# Patient Record
Sex: Male | Born: 1995 | Race: Black or African American | Hispanic: No | Marital: Single | Smoking: Current every day smoker
Health system: Southern US, Community
[De-identification: ages and names within clinical notes are randomized; demographics above are authoritative.]

---
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
## 2014-01-16 ENCOUNTER — Ambulatory Visit (INDEPENDENT_AMBULATORY_CARE_PROVIDER_SITE_OTHER): Payer: Self-pay | Admitting: Family Medicine

## 2014-01-16 VITALS — BP 122/70 | HR 68 | Temp 98.2°F | Resp 18 | Ht 71.0 in | Wt 147.0 lb

## 2014-01-16 DIAGNOSIS — Z87820 Personal history of traumatic brain injury: Secondary | ICD-10-CM

## 2014-01-16 NOTE — Progress Notes (Signed)
18 year old male who is here for follow-up of a concussion he had a decade ago. He has been applying for a job, and they will hard that he be cleared from his concussion. He has been seen once here for it, was told to return if he had further problems, and never had problems and never came back for it. He has been here a time or 2 over the years for other things. He is been quite healthy.  Operations: None Illnesses: None Regular medications: None Allergies: None Drugs: None Alcohol: None Tobacco: None  He went to high school and has been working some various jobs since that time. He hopes to be able to get more education when he gets in the [employer].  Review of systems: Unremarkable  Exam: TMs are normal. Eyes PERRLA. EOMs intact. Throat clear. Neck supple without nodes thyromegaly. No carotid bruits. Chest is clear to auscultation. Heart regular without murmurs. Abdomen soft without mass tenderness. Extremities normal. Gait normal. Motor strength.should grossly normal. Gait normal. Finger to nose normal. Tandem walk normal. Romberg negative.  Assessment: History of concussion, no residual  Plan: Wrote a letter for him. Return as needed

## 2014-01-16 NOTE — Patient Instructions (Signed)
Return as necessary if you need further assistance

## 2015-06-03 ENCOUNTER — Ambulatory Visit (INDEPENDENT_AMBULATORY_CARE_PROVIDER_SITE_OTHER): Payer: BLUE CROSS/BLUE SHIELD | Admitting: Family Medicine

## 2015-06-03 VITALS — BP 104/68 | HR 83 | Temp 98.0°F | Resp 16 | Ht 70.0 in | Wt 154.0 lb

## 2015-06-03 DIAGNOSIS — K0889 Other specified disorders of teeth and supporting structures: Secondary | ICD-10-CM

## 2015-06-03 MED ORDER — IBUPROFEN 600 MG PO TABS: 600.0000 mg | ORAL_TABLET | Freq: Four times a day (QID) | ORAL | Status: DC | PRN

## 2015-06-03 NOTE — Progress Notes (Signed)
Subjective:  By signing my name below, I, William Hardy, attest that this documentation has been prepared under the direction and in the presence of William Staggers, MD. Electronically Signed: Stann Hardy, Scribe. 06/03/2015 , 6:39 PM .  Patient was seen in Room 6 .   Patient ID: William Hardy William Hardy, male    DOB: [birth year], 20 y.o.   MRN: [id number] Chief Complaint  Patient presents with  . Dental Pain   HPI William Hardy is a 20 y.o. male Here for dental pain that started last night. He has supernumerary wisdom teeth.  He states that his left lower wisdom tooth gives him an occasional pain every 2 months. The pain will intensify for a bit, and will eventually reside. He denies taking any medications for the pain.   He has a Education officer, community that he sees. He was recommended to have surgeries done to remove his wisdom teeth; however, he denies history of any dental surgery. He was unable to eat yesterday due to the pain. He denies any fever, chills, facial swelling, or cold sensitivity. He denies history of ulcers.  Dentist: Dr. Gwendolyn Hardy, in [place], [place]  He had ascheduled appointment for today at dentist; however, he wasn't able to be seen. His work allows him 1 sick day a month.   There are no active problems to display for this patient.  History reviewed. No pertinent past medical history. History reviewed. No pertinent past surgical history. No Known Allergies Prior to Admission medications   Medication Sig Start Date End Date Taking? Authorizing Provider  meloxicam (MOBIC) 15 MG tablet Take 1 tablet (15 mg total) by mouth daily. Patient not taking: Reported on 06/03/2015 10/29/12   Sherren Mocha, MD  ranitidine (ZANTAC) 150 MG tablet Take 1 tablet (150 mg total) by mouth 2 (two) times daily. Patient not taking: Reported on 06/03/2015 10/29/12   Sherren Mocha, MD   Social History   Social History  . Marital Status: Single    Spouse Name: N/A  . Number of Children: N/A  . Years of  Education: N/A   Occupational History  . Not on file.   Social History Main Topics  . Smoking status: Never Smoker   . Smokeless tobacco: Never Used  . Alcohol Use: No  . Drug Use: No  . Sexual Activity: Not on file   Other Topics Concern  . Not on file   Social History Narrative   Review of Systems  Constitutional: Negative for fever, chills and fatigue.  HENT: Positive for dental problem. Negative for facial swelling and mouth sores.   Gastrointestinal: Negative for nausea, vomiting and diarrhea.  Endocrine: Negative for cold intolerance.       Objective:   Physical Exam  Constitutional: He is oriented to person, place, and time. He appears well-developed and well-nourished. No distress.  HENT:  Head: Normocephalic and atraumatic.  Partially erupted 3rd lower molar, with erythema over buccal aspect; tender along buccal aspect, but no discharge when pressed; no apparent decay  Eyes: EOM are normal. Pupils are equal, round, and reactive to light.  Neck: Neck supple.  Cardiovascular: Normal rate.   Pulmonary/Chest: Effort normal. No respiratory distress.  Musculoskeletal: Normal range of motion.  Neurological: He is alert and oriented to person, place, and time.  Skin: Skin is warm and dry.  Psychiatric: He has a normal mood and affect. His behavior is normal.  Nursing note and vitals reviewed.   Filed Vitals:   06/03/15 1743  BP: 104/68  Pulse: 83  Temp: 98 F (36.7 C)  Resp: 16  Height: 5\' 10"  (1.778 m)  Weight: 154 lb (69.854 kg)  SpO2: 99%      Assessment & Plan:   William Hardy is a 20 y.o. male Pain, dental - Plan: ibuprofen (ADVIL,MOTRIN) 600 MG tablet  No apparent periodontal abscess or face swelling. Suspect his pain is coming from the third lower molar on the left/eruption of wisdom tooth. Per his hx - pain usually resolves within a day or two, but may need extraction.   - Note given for work today as he missed due to the appointment with  dentist earlier, but was not able to be seen.   -Advised him to follow-up with his dental provider if pain persists, but for now can take ibuprofen 600 mg every 6 hours as needed. RTC precautions.  Meds ordered this encounter  Medications  . ibuprofen (ADVIL,MOTRIN) 600 MG tablet    Sig: Take 1 tablet (600 mg total) by mouth every 6 (six) hours as needed.    Dispense:  30 tablet    Refill:  0   Patient Instructions       IF you received an x-ray today, you will receive an [facility]. Please contact [facility] at [phone] with questions or concerns regarding your invoice.   IF you received labwork today, you will receive an invoice from [facility]. Please contact [facility] at [phone] with questions or concerns regarding your invoice.   Our billing staff will not be able to assist you with questions regarding bills from these companies.  You will be contacted with the lab results as soon as they are available. The fastest way to get your results is to activate your My Chart account. Instructions are located on the last page of this paperwork. If you have not heard from us regarding the results in 2 weeks, please contact this office.    You can try the ibuprofen 600 mg every 6 hours as needed with food for pain and avoid chewing on that side for right now. If pain is not improving the next day or 2, follow-up with dentist as discussed. If any swelling of the face, fevers, chills, or other worsening, recommend recheck here or your dental provider right away.   Dental Pain Dental pain may be caused by many things, including:  Tooth decay (cavities or caries). Cavities expose the nerve of your tooth to air and hot or cold temperatures. This can cause pain or discomfort.  Abscess or infection. A dental abscess is a collection of infected pus from a bacterial infection in the inner part of the tooth (pulp). It usually occurs  at the end of the tooth's root.  Injury.  An unknown reason (idiopathic). Your pain may be mild or severe. It may only occur when:  You are chewing.  You are exposed to hot or cold temperature.  You are eating or drinking sugary foods or beverages, such as soda or candy. Your pain may also be constant. HOME CARE INSTRUCTIONS Watch your dental pain for any changes. The following actions may help to lessen any discomfort that you are feeling:  Take medicines only as directed by your dentist.  If you were prescribed an antibiotic medicine, finish all of it even if you start to feel better.  Keep all follow-up visits as directed by your dentist. This is important.  Do not apply heat to the outside of  your face.  Rinse your mouth or gargle with salt water if directed by your dentist. This helps with pain and swelling.  You can make salt water by adding  tsp of salt to 1 cup of warm water.  Apply ice to the painful area of your face:  Put ice in a plastic bag.  Place a towel between your skin and the bag.  Leave the ice on for 20 minutes, 2-3 times per day.  Avoid foods or drinks that cause you pain, such as:  Very hot or very cold foods or drinks.  Sweet or sugary foods or drinks. SEEK MEDICAL CARE IF:  Your pain is not controlled with medicines.  Your symptoms are worse.  You have new symptoms. SEEK IMMEDIATE MEDICAL CARE IF:  You are unable to open your mouth.  You are having trouble breathing or swallowing.  You have a fever.  Your face, neck, or jaw is swollen.   This information is not intended to replace advice given to you by your health care provider. Make sure you discuss any questions you have with your health care provider.   Document Released: 02/22/2005 Document Revised: 07/09/2014 Document Reviewed: 02/18/2014 Elsevier Interactive Patient Education [employer]     I personally performed the services described in this documentation,  which was scribed in my presence. The recorded information has been reviewed and considered, and addended by me as needed.

## 2015-06-03 NOTE — Patient Instructions (Addendum)
IF you received an x-ray today, you will receive an invoice from [facility]. Please contact [facility] at [phone] with questions or concerns regarding your invoice.   IF you received labwork today, you will receive an invoice from [facility]. Please contact [facility] at [phone] with questions or concerns regarding your invoice.   Our billing staff will not be able to assist you with questions regarding bills from these companies.  You will be contacted with the lab results as soon as they are available. The fastest way to get your results is to activate your My Chart account. Instructions are located on the last page of this paperwork. If you have not heard from us regarding the results in 2 weeks, please contact this office.    You can try the ibuprofen 600 mg every 6 hours as needed with food for pain and avoid chewing on that side for right now. If pain is not improving the next day or 2, follow-up with dentist as discussed. If any swelling of the face, fevers, chills, or other worsening, recommend recheck here or your dental provider right away.   Dental Pain Dental pain may be caused by many things, including:  Tooth decay (cavities or caries). Cavities expose the nerve of your tooth to air and hot or cold temperatures. This can cause pain or discomfort.  Abscess or infection. A dental abscess is a collection of infected pus from a bacterial infection in the inner part of the tooth (pulp). It usually occurs at the end of the tooth's root.  Injury.  An unknown reason (idiopathic). Your pain may be mild or severe. It may only occur when:  You are chewing.  You are exposed to hot or cold temperature.  You are eating or drinking sugary foods or beverages, such as soda or candy. Your pain may also be constant. HOME CARE INSTRUCTIONS Watch your dental pain for any changes. The following actions may help to lessen any  discomfort that you are feeling:  Take medicines only as directed by your dentist.  If you were prescribed an antibiotic medicine, finish all of it even if you start to feel better.  Keep all follow-up visits as directed by your dentist. This is important.  Do not apply heat to the outside of your face.  Rinse your mouth or gargle with salt water if directed by your dentist. This helps with pain and swelling.  You can make salt water by adding  tsp of salt to 1 cup of warm water.  Apply ice to the painful area of your face:  Put ice in a plastic bag.  Place a towel between your skin and the bag.  Leave the ice on for 20 minutes, 2-3 times per day.  Avoid foods or drinks that cause you pain, such as:  Very hot or very cold foods or drinks.  Sweet or sugary foods or drinks. SEEK MEDICAL CARE IF:  Your pain is not controlled with medicines.  Your symptoms are worse.  You have new symptoms. SEEK IMMEDIATE MEDICAL CARE IF:  You are unable to open your mouth.  You are having trouble breathing or swallowing.  You have a fever.  Your face, neck, or jaw is swollen.   This information is not intended to replace advice given to you by your health care provider. Make sure you discuss any questions you have with your health care provider.   Document Released: 02/22/2005 Document Revised: 07/09/2014 Document  Reviewed: 02/18/2014 Elsevier Interactive Patient Education [employer].

## 2015-06-25 ENCOUNTER — Ambulatory Visit (INDEPENDENT_AMBULATORY_CARE_PROVIDER_SITE_OTHER): Payer: BLUE CROSS/BLUE SHIELD | Admitting: Physician Assistant

## 2015-06-25 VITALS — BP 104/70 | HR 62 | Temp 97.8°F | Resp 16 | Ht 70.0 in | Wt 160.7 lb

## 2015-06-25 DIAGNOSIS — F17200 Nicotine dependence, unspecified, uncomplicated: Secondary | ICD-10-CM

## 2015-06-25 DIAGNOSIS — S0181XD Laceration without foreign body of other part of head, subsequent encounter: Secondary | ICD-10-CM | POA: Diagnosis not present

## 2015-06-25 DIAGNOSIS — F17210 Nicotine dependence, cigarettes, uncomplicated: Secondary | ICD-10-CM

## 2015-06-25 NOTE — Patient Instructions (Signed)
     IF you received an x-ray today, you will receive an invoice from [facility]. Please contact  Radiology at [phone] with questions or concerns regarding your invoice.   IF you received labwork today, you will receive an invoice from [facility]. Please contact [facility] at [phone] with questions or concerns regarding your invoice.   Our billing staff will not be able to assist you with questions regarding bills from these companies.  You will be contacted with the lab results as soon as they are available. The fastest way to get your results is to activate your My Chart account. Instructions are located on the last page of this paperwork. If you have not heard from us regarding the results in 2 weeks, please contact this office.      

## 2015-06-25 NOTE — Progress Notes (Signed)
   06/25/2015 2:33 PM   DOB: 07/15/1995 / MRN: [id number]  SUBJECTIVE:  William Hardy is a 20 y.o. male presenting for follow up of an eye laceration that was sustained in February of 2017.  Reports something hit him above the left eye and he sustained a cut.  He will not tell me how this happened.  Today he denies any left eye symptoms and denies any tenderness about the wound, which was closed with skin adhesive.  He is a smoker and is not ready to quit at this time, although he has thought about quitting.   He has No Known Allergies.   He  has no past medical history on file.    He  reports that he has never smoked. He has never used smokeless tobacco. He reports that he does not drink alcohol or use illicit drugs. He  has no sexual activity history on file. The patient  has no past surgical history on file.  His family history is not on file.  Review of Systems  Constitutional: Negative for fever.  Eyes: Negative for blurred vision, double vision, photophobia, pain, discharge and redness.  Respiratory: Negative for cough.   Gastrointestinal: Negative for nausea.  Genitourinary: Negative for dysuria.  Skin: Negative for rash.  Neurological: Negative for dizziness and headaches.    Problem list and medications reviewed and updated by myself where necessary, and exist elsewhere in the encounter.   OBJECTIVE:  BP 104/70 mmHg  Pulse 62  Temp(Src) 97.8 F (36.6 C) (Oral)  Resp 16  Ht 5\' 10"  (1.778 m)  Wt 160 lb 11 oz (72.889 kg)  BMI 23.06 kg/m2  SpO2 97%   Visual Acuity Screening   Right eye Left eye Both eyes  Without correction:     With correction: 20/15 20/13 20/15      Physical Exam  Constitutional: He is oriented to person, place, and time. He appears well-developed. He does not appear ill.  Eyes: Conjunctivae and EOM are normal. Pupils are equal, round, and reactive to light.    Cardiovascular: Normal rate.   Pulmonary/Chest: Effort normal.  Abdominal: He  exhibits no distension.  Musculoskeletal: Normal range of motion.  Neurological: He is alert and oriented to person, place, and time. No cranial nerve deficit. Coordination normal.  Skin: Skin is warm and dry. He is not diaphoretic.  Psychiatric: He has a normal mood and affect.  Nursing note and vitals reviewed.   No results found for this or any previous visit (from the past 72 hour(s)).  No results found.  ASSESSMENT AND PLAN  William Hardy was seen today for eye injury.  Diagnoses and all orders for this visit:  Facial laceration, subsequent encounter: The wound is healed.  No complications and no need for follow up. Work note provided.   Smoker unmotivated to quit: Advised he try to prepare to quit, RTC when ready if he needs medical help.     The patient was advised to call or return to clinic if he does not see an improvement in symptoms or to seek the care of the closest emergency department if he worsens with the above plan.   Deliah BostonMichael Marci Polito, MHS, PA-C [facility] 06/25/2015 2:33 PM

## 2015-07-21 ENCOUNTER — Ambulatory Visit (INDEPENDENT_AMBULATORY_CARE_PROVIDER_SITE_OTHER): Payer: BLUE CROSS/BLUE SHIELD | Admitting: Family Medicine

## 2015-07-21 VITALS — BP 126/72 | HR 60 | Temp 97.8°F | Resp 16 | Ht 70.0 in | Wt 158.0 lb

## 2015-07-21 DIAGNOSIS — G43009 Migraine without aura, not intractable, without status migrainosus: Secondary | ICD-10-CM | POA: Diagnosis not present

## 2015-07-21 DIAGNOSIS — F17200 Nicotine dependence, unspecified, uncomplicated: Secondary | ICD-10-CM | POA: Diagnosis not present

## 2015-07-21 MED ORDER — BUTALBITAL-APAP-CAFFEINE 50-325-40 MG PO TABS: 1.0000 | ORAL_TABLET | Freq: Four times a day (QID) | ORAL | Status: DC | PRN

## 2015-07-21 MED ORDER — TOPIRAMATE 25 MG PO CPSP: 25.0000 mg | ORAL_CAPSULE | Freq: Two times a day (BID) | ORAL | Status: DC

## 2015-07-21 NOTE — Patient Instructions (Addendum)
Take Topamax 25 mg 1 at bedtime. After one week increase to 1 at bedtime and 1 in the morning. Take this consistently to see if we can start trying to block you from having migraines  For mild headache continue using the Excedrin as needed  Take the generic Fioricet 1 or 2 pills every 6 hours only if needed for severe headache  Return in 2 weeks for a recheck  Stop smoking!   Migraine Headache A migraine headache is an intense, throbbing pain on one or both sides of your head. A migraine can last for 30 minutes to several hours. CAUSES  The exact cause of a migraine headache is not always known. However, a migraine may be caused when nerves in the brain become irritated and release chemicals that cause inflammation. This causes pain. Certain things may also trigger migraines, such as:  Alcohol.  Smoking.  Stress.  Menstruation.  Aged cheeses.  Foods or drinks that contain nitrates, glutamate, aspartame, or tyramine.  Lack of sleep.  Chocolate.  Caffeine.  Hunger.  Physical exertion.  Fatigue.  Medicines used to treat chest pain (nitroglycerine), birth control pills, estrogen, and some blood pressure medicines. SIGNS AND SYMPTOMS  Pain on one or both sides of your head.  Pulsating or throbbing pain.  Severe pain that prevents daily activities.  Pain that is aggravated by any physical activity.  Nausea, vomiting, or both.  Dizziness.  Pain with exposure to bright lights, loud noises, or activity.  General sensitivity to bright lights, loud noises, or smells. Before you get a migraine, you may get warning signs that a migraine is coming (aura). An aura may include:  Seeing flashing lights.  Seeing bright spots, halos, or zigzag lines.  Having tunnel vision or blurred vision.  Having feelings of numbness or tingling.  Having trouble talking.  Having muscle weakness. DIAGNOSIS  A migraine headache is often diagnosed based  on:  Symptoms.  Physical exam.  A CT scan or MRI of your head. These imaging tests cannot diagnose migraines, but they can help rule out other causes of headaches. TREATMENT Medicines may be given for pain and nausea. Medicines can also be given to help prevent recurrent migraines.  HOME CARE INSTRUCTIONS  Only take over-the-counter or prescription medicines for pain or discomfort as directed by your health care provider. The use of long-term narcotics is not recommended.  Lie down in a dark, quiet room when you have a migraine.  Keep a journal to find out what may trigger your migraine headaches. For example, write down:  What you eat and drink.  How much sleep you get.  Any change to your diet or medicines.  Limit alcohol consumption.  Quit smoking if you smoke.  Get 7-9 hours of sleep, or as recommended by your health care provider.  Limit stress.  Keep lights dim if bright lights bother you and make your migraines worse. SEEK IMMEDIATE MEDICAL CARE IF:   Your migraine becomes severe.  You have a fever.  You have a stiff neck.  You have vision loss.  You have muscular weakness or loss of muscle control.  You start losing your balance or have trouble walking.  You feel faint or pass out.  You have severe symptoms that are different from your first symptoms. MAKE SURE YOU:   Understand these instructions.  Will watch your condition.  Will get help right away if you are not doing well or get worse.   This information is not intended  to replace advice given to you by your health care provider. Make sure you discuss any questions you have with your health care provider.   Document Released: 02/22/2005 Document Revised: 03/15/2014 Document Reviewed: 10/30/2012 Elsevier Interactive Patient Education 2016 [employer]    IF you received an x-ray today, you will receive an invoice from [facility]. Please contact [facility] at  [phone] with questions or concerns regarding your invoice.   IF you received labwork today, you will receive an invoice from [facility]. Please contact [facility] at [phone] with questions or concerns regarding your invoice.   Our billing staff will not be able to assist you with questions regarding bills from these companies.  You will be contacted with the lab results as soon as they are available. The fastest way to get your results is to activate your My Chart account. Instructions are located on the last page of this paperwork. If you have not heard from us regarding the results in 2 weeks, please contact this office.

## 2015-07-21 NOTE — Progress Notes (Signed)
Patient ID: William Hardy, male    DOB: 09/20/1995  Age: 20 y.o. MRN: [id number]  Chief Complaint  Patient presents with  . Headache    pt says been having for 2 years getting worse     Subjective:   20 year old male who is here complaining of headaches which been getting worse over the last 2 years. His mother has a history of migraines. He is getting them more than other day. He usually takes Excedrin Migraine. He does not know what brings them on. The stress and sound at work does make a difference and makes things worse. He works at [employer]. He smokes. We talked about that. He does not do regular exercise but he plays basketball couple times a week. Not on any other medicines or drugs.  Current allergies, medications, problem list, past/family and social histories reviewed.  Objective:  BP 126/72 mmHg  Pulse 60  Temp(Src) 97.8 F (36.6 C) (Oral)  Resp 16  Ht 5\' 10"  (1.778 m)  Wt 158 lb (71.668 kg)  BMI 22.67 kg/m2  SpO2 99%  Alert and oriented. TMs normal. Eyes PERRLA. EOMs intact. Fundi benign. Throat clear. Neck supple without nodes thyromegaly. No carotid bruits. Chest clear to auscultation. Heart regular without murmurs. Himself without mass or tenderness. Extremities unremarkable.  Assessment & Plan:   Assessment: 1. Migraine without aura and without status migrainosus, not intractable   2. Tobacco use disorder       Plan: See instructions. Advised him to return in a couple of weeks. May need to go up on his Topamax eventually. Also may consider referring to a headache pain clinic if he is not doing better.  No orders of the defined types were placed in this encounter.    Meds ordered this encounter  Medications  . aspirin-acetaminophen-caffeine (EXCEDRIN MIGRAINE) 250-250-65 MG tablet    Sig: Take by mouth every 6 (six) hours as needed for headache.  Marland Kitchen. DISCONTD: topiramate (TOPAMAX) 25 MG capsule    Sig: Take 25 mg by mouth 2 (two) times daily. Take one at  bedtime.   In one week increase to one twice daily for migraine prevention  . topiramate (TOPAMAX) 25 MG capsule    Sig: Take 1 capsule (25 mg total) by mouth 2 (two) times daily. Take one at bedtime.   In one week increase to one twice daily for migraine prevention    Dispense:  30 capsule    Refill:  0    Refill for 60 pills, 3 refills.  . butalbital-acetaminophen-caffeine (FIORICET) 50-325-40 MG tablet    Sig: Take 1-2 tablets by mouth every 6 (six) hours as needed for headache.    Dispense:  40 tablet    Refill:  0         Patient Instructions   Take Topamax 25 mg 1 at bedtime. After one week increase to 1 at bedtime and 1 in the morning. Take this consistently to see if we can start trying to block you from having migraines  For mild headache continue using the Excedrin as needed  Take the generic Fioricet 1 or 2 pills every 6 hours only if needed for severe headache  Return in 2 weeks for a recheck  Stop smoking!   Migraine Headache A migraine headache is an intense, throbbing pain on one or both sides of your head. A migraine can last for 30 minutes to several hours. CAUSES  The exact cause of a migraine headache is not always known.  However, a migraine may be caused when nerves in the brain become irritated and release chemicals that cause inflammation. This causes pain. Certain things may also trigger migraines, such as:  Alcohol.  Smoking.  Stress.  Menstruation.  Aged cheeses.  Foods or drinks that contain nitrates, glutamate, aspartame, or tyramine.  Lack of sleep.  Chocolate.  Caffeine.  Hunger.  Physical exertion.  Fatigue.  Medicines used to treat chest pain (nitroglycerine), birth control pills, estrogen, and some blood pressure medicines. SIGNS AND SYMPTOMS  Pain on one or both sides of your head.  Pulsating or throbbing pain.  Severe pain that prevents daily activities.  Pain that is aggravated by any physical activity.  Nausea,  vomiting, or both.  Dizziness.  Pain with exposure to bright lights, loud noises, or activity.  General sensitivity to bright lights, loud noises, or smells. Before you get a migraine, you may get warning signs that a migraine is coming (aura). An aura may include:  Seeing flashing lights.  Seeing bright spots, halos, or zigzag lines.  Having tunnel vision or blurred vision.  Having feelings of numbness or tingling.  Having trouble talking.  Having muscle weakness. DIAGNOSIS  A migraine headache is often diagnosed based on:  Symptoms.  Physical exam.  A CT scan or MRI of your head. These imaging tests cannot diagnose migraines, but they can help rule out other causes of headaches. TREATMENT Medicines may be given for pain and nausea. Medicines can also be given to help prevent recurrent migraines.  HOME CARE INSTRUCTIONS  Only take over-the-counter or prescription medicines for pain or discomfort as directed by your health care provider. The use of long-term narcotics is not recommended.  Lie down in a dark, quiet room when you have a migraine.  Keep a journal to find out what may trigger your migraine headaches. For example, write down:  What you eat and drink.  How much sleep you get.  Any change to your diet or medicines.  Limit alcohol consumption.  Quit smoking if you smoke.  Get 7-9 hours of sleep, or as recommended by your health care provider.  Limit stress.  Keep lights dim if bright lights bother you and make your migraines worse. SEEK IMMEDIATE MEDICAL CARE IF:   Your migraine becomes severe.  You have a fever.  You have a stiff neck.  You have vision loss.  You have muscular weakness or loss of muscle control.  You start losing your balance or have trouble walking.  You feel faint or pass out.  You have severe symptoms that are different from your first symptoms. MAKE SURE YOU:   Understand these instructions.  Will watch your  condition.  Will get help right away if you are not doing well or get worse.   This information is not intended to replace advice given to you by your health care provider. Make sure you discuss any questions you have with your health care provider.   Document Released: 02/22/2005 Document Revised: 03/15/2014 Document Reviewed: 10/30/2012 Elsevier Interactive Patient Education 2016 [employer].    IF you received an x-ray today, you will receive an invoice from [facility]. Please contact [facility] at [phone] with questions or concerns regarding your invoice.   IF you received labwork today, you will receive an invoice from [facility]. Please contact [facility] at [phone] with questions or concerns regarding your invoice.   Our billing staff will not be able to assist you with questions regarding bills  from these companies.  You will be contacted with the lab results as soon as they are available. The fastest way to get your results is to activate your My Chart account. Instructions are located on the last page of this paperwork. If you have not heard from [place] regarding the results in 2 weeks, please contact this office.         No Follow-up on file.   Brittney Caraway, MD 07/21/2015

## 2015-07-23 DIAGNOSIS — Z0271 Encounter for disability determination: Secondary | ICD-10-CM

## 2015-09-05 ENCOUNTER — Ambulatory Visit (INDEPENDENT_AMBULATORY_CARE_PROVIDER_SITE_OTHER): Payer: BLUE CROSS/BLUE SHIELD | Admitting: Physician Assistant

## 2015-09-05 VITALS — BP 122/84 | HR 86 | Temp 98.8°F | Resp 16 | Ht 70.0 in | Wt 157.0 lb

## 2015-09-05 DIAGNOSIS — L508 Other urticaria: Secondary | ICD-10-CM | POA: Diagnosis not present

## 2015-09-05 MED ORDER — HYDROXYZINE HCL 25 MG PO TABS: 25.0000 mg | ORAL_TABLET | Freq: Every day | ORAL | Status: DC | PRN

## 2015-09-05 NOTE — Patient Instructions (Addendum)
You can buy over the counter zyrtec or allegra (can find this in the allergy isle at [facility]). Take this during the day for itching. You can take hydroxyzine (0.5 to 1 pill) at night for itching.  If symptoms worsen or do not improve please come back.   Hydroxyzine capsules or tablets What is this medicine? HYDROXYZINE (hye Buckeye i zeen) is an antihistamine. This medicine is used to treat allergy symptoms. It is also used to treat anxiety and tension. This medicine can be used with other medicines to induce sleep before surgery. This medicine may be used for other purposes; ask your health care provider or pharmacist if you have questions. What should I tell my health care provider before I take this medicine? They need to know if you have any of these conditions: -any chronic illness -difficulty passing urine -glaucoma -heart disease -kidney disease -liver disease -lung disease -an unusual or allergic reaction to hydroxyzine, cetirizine, other medicines, foods, dyes, or preservatives -pregnant or trying to get pregnant -breast-feeding How should I use this medicine? Take this medicine by mouth with a full glass of water. Follow the directions on the prescription label. You may take this medicine with food or on an empty stomach. Take your medicine at regular intervals. Do not take your medicine more often than directed. Talk to your pediatrician regarding the use of this medicine in children. Special care may be needed. While this drug may be prescribed for children as young as 23 years of age for selected conditions, precautions do apply. Patients over 65 years old may have a stronger reaction and need a smaller dose. Overdosage: If you think you have taken too much of this medicine contact a poison control center or emergency room at once. NOTE: This medicine is only for you. Do not share this medicine with others. What if I miss a dose? If you miss a dose, take it as soon as you can. If it is  almost time for your next dose, take only that dose. Do not take double or extra doses. What may interact with this medicine? -alcohol -barbiturate medicines for sleep or seizures -medicines for colds, allergies -medicines for depression, anxiety, or emotional disturbances -medicines for pain -medicines for sleep -muscle relaxants This list may not describe all possible interactions. Give your health care provider a list of all the medicines, herbs, non-prescription drugs, or dietary supplements you use. Also tell them if you smoke, drink alcohol, or use illegal drugs. Some items may interact with your medicine. What should I watch for while using this medicine? Tell your doctor or health care professional if your symptoms do not improve. You may get drowsy or dizzy. Do not drive, use machinery, or do anything that needs mental alertness until you know how this medicine affects you. Do not stand or sit up quickly, especially if you are an older patient. This reduces the risk of dizzy or fainting spells. Alcohol may interfere with the effect of this medicine. Avoid alcoholic drinks. Your mouth may get dry. Chewing sugarless gum or sucking hard candy, and drinking plenty of water may help. Contact your doctor if the problem does not go away or is severe. This medicine may cause dry eyes and blurred vision. If you wear contact lenses you may feel some discomfort. Lubricating drops may help. See your eye doctor if the problem does not go away or is severe. If you are receiving skin tests for allergies, tell your doctor you are using this medicine.  What side effects may I notice from receiving this medicine? Side effects that you should report to your doctor or health care professional as soon as possible: -fast or irregular heartbeat -difficulty passing urine -seizures -slurred speech or confusion -tremor Side effects that usually do not require medical attention (report to your doctor or health  care professional if they continue or are bothersome): -constipation -drowsiness -fatigue -headache -stomach upset This list may not describe all possible side effects. Call your doctor for medical advice about side effects. You may report side effects to FDA at 1-800-FDA-1088. Where should I keep my medicine? Keep out of the reach of children. Store at room temperature between 15 and 30 degrees C (59 and 86 degrees F). Keep container tightly closed. Throw away any unused medicine after the expiration date. NOTE: This sheet is a summary. It may not cover all possible information. If you have questions about this medicine, talk to your doctor, pharmacist, or health care provider.    2016, Elsevier/Gold Standard. (2007-07-07 14:50:59) Bee, Wasp, or [employer], wasps, and hornets are part of a family of insects that can sting people. These stings can cause pain and inflammation, but they are usually not serious. However, some people may have an allergic reaction to a sting. This can cause the symptoms to be more severe.  SYMPTOMS  Common symptoms of this condition include:   A red lump in the skin that sometimes has a tiny hole in the center. In some cases, a stinger may be in the center of the wound.  Pain and itching at the sting site.  Redness and swelling around the sting site. If you have an allergic reaction (localized allergic reaction), the swelling and redness may spread out from the sting site. In some cases, this reaction can continue to develop over the next 12-36 hours. In rare cases, a person may have a severe allergic reaction (anaphylactic reaction) to a sting. Symptoms of an anaphylactic reaction may include:   Wheezing or difficulty breathing.  Raised, itchy, red patches on the skin.  Nausea or vomiting.  Abdominal cramping.  Diarrhea.  Chest pain.  Fainting.  Redness of the face (flushing). DIAGNOSIS  This condition is usually diagnosed based on  symptoms, medical history, and a physical exam. TREATMENT  Most stings can be treated with:   Icing to reduce swelling.  Medicines (antihistamines) to treat itching or an allergic reaction.  Medicines to help reduce pain. These may be medicines that you take by mouth, or medicated creams or lotions that you apply to your skin. If you were stung by a bee, the stinger and a small sac of poison may be in the wound. This may be removed by brushing across it with a flat card, such as a credit card. Another method is to pinch the area and pull it out. These methods can help reduce the severity of the body's reaction to the sting.  HOME CARE INSTRUCTIONS   Wash the sting site daily with soap and water as told by your health care provider.  Apply or take over-the-counter and prescription medicines only as told by your health care provider.  If directed, apply ice to the sting area.  Put ice in a plastic bag.  Place a towel between your skin and the bag.  Leave the ice on for 20 minutes, 2-3 times per day.  Do not scratch the sting area.  To lessen pain, try using a paste that is made of water and  baking soda. Rub the paste on the sting area and leave it on for 5 minutes.  If you had a severe allergic reaction to a sting, you may need:  To wear a medical bracelet or necklace that lists the allergy.  To learn when and how to use an anaphylaxis kit or epinephrine injection. Your family members may also need to learn this.  To carry an anaphylaxis kit with you at all times. SEEK MEDICAL CARE IF:   Your symptoms do not get better in 2-3 days.  You have redness, swelling, or pain that spreads beyond the area of the sting.  You have a fever. SEEK IMMEDIATE MEDICAL CARE IF:  You have symptoms of a severe allergic reaction. These include:   Wheezing or difficulty breathing.  Chest pain.  Light-headedness or fainting.  Itchy, raised, red patches on the skin.  Nausea or  vomiting.  Abdominal cramping.  Diarrhea.   This information is not intended to replace advice given to you by your health care provider. Make sure you discuss any questions you have with your health care provider.   Document Released: 02/22/2005 Document Revised: 11/13/2014 Document Reviewed: 07/10/2014 Elsevier Interactive Patient Education 2016 [employer].    IF you received an x-ray today, you will receive an invoice from [facility]. Please contact [facility] at [phone] with questions or concerns regarding your invoice.   IF you received labwork today, you will receive an invoice from [facility]. Please contact [facility] at [phone] with questions or concerns regarding your invoice.   Our billing staff will not be able to assist you with questions regarding bills from these companies.  You will be contacted with the lab results as soon as they are available. The fastest way to get your results is to activate your My Chart account. Instructions are located on the last page of this paperwork. If you have not heard from [place] regarding the results in 2 weeks, please contact this office.

## 2015-09-05 NOTE — Progress Notes (Signed)
   William Primereston E Florio  MRN: [id number] DOB: 01/11/1996  Subjective:  William Hardy is a 20 y.o. male seen in office today for a chief complaint of possible bee sting on left leg. Pt was sitting outside on porch 2 days ago and felt a sudden sting so he swatted whatever was on his leg away. He did not see the bee but does note there is a big wasp nest on his porch. After this, the area began itching. He continued to scratch but did not try any OTC medications. The area began to become more red and itching. Pt states that he asked his mom what to do and she told him it looked okay so not to worry about it. Pt was concerned the bee "laid an egg" in his leg so he wanted to come in and make sure it was okay.    There are no active problems to display for this patient.   Current Outpatient Prescriptions on File Prior to Visit  Medication Sig Dispense Refill  . ibuprofen (ADVIL,MOTRIN) 600 MG tablet Take 1 tablet (600 mg total) by mouth every 6 (six) hours as needed. 30 tablet 0  . butalbital-acetaminophen-caffeine (FIORICET) 50-325-40 MG tablet Take 1-2 tablets by mouth every 6 (six) hours as needed for headache. (Patient not taking: Reported on 09/05/2015) 40 tablet 0  . topiramate (TOPAMAX) 25 MG capsule Take 1 capsule (25 mg total) by mouth 2 (two) times daily. Take one at bedtime.   In one week increase to one twice daily for migraine prevention (Patient not taking: Reported on 09/05/2015) 30 capsule 0   No current facility-administered medications on file prior to visit.    No Known Allergies  Review of Systems  Constitutional: Negative for fever.  HENT: Negative for trouble swallowing.   Respiratory: Negative for chest tightness, shortness of breath and wheezing.    Objective:  BP 122/84 mmHg  Pulse 86  Temp(Src) 98.8 F (37.1 C) (Oral)  Resp 16  Ht 5\' 10"  (1.778 m)  Wt 157 lb (71.215 kg)  BMI 22.53 kg/m2  SpO2 98%  Physical Exam  Constitutional: He is oriented to person, place,  and time and well-developed, well-nourished, and in no distress.  HENT:  Head: Normocephalic and atraumatic.  Eyes: Conjunctivae are normal.  Neck: Normal range of motion.  Pulmonary/Chest: Effort normal.  Musculoskeletal:       Left lower leg: He exhibits no tenderness, no swelling and no laceration.       Legs: Neurological: He is alert and oriented to person, place, and time. Gait normal.  Skin: Skin is warm and dry.  Psychiatric: Affect normal.  Vitals reviewed.   Assessment and Plan :   1. Acute urticaria -Pt informed to buy over the counter zyrtec or allegra (can find this in [facility]). Take this during the day for itching. You can take hydroxyzine (0.5 to 1 pill) at night for itching.  If symptoms worsen or do not improve please come back.  -Prescribed Hydroxizine 25 mg tablet: Take 0.5-1 pill at bedtime prn for itching.  Benjiman CoreBrittany Shaheim Mahar PA-C  [facility] 09/05/2015 6:11 PM

## 2015-11-17 ENCOUNTER — Ambulatory Visit (INDEPENDENT_AMBULATORY_CARE_PROVIDER_SITE_OTHER): Payer: BLUE CROSS/BLUE SHIELD | Admitting: Family Medicine

## 2015-11-17 ENCOUNTER — Ambulatory Visit (INDEPENDENT_AMBULATORY_CARE_PROVIDER_SITE_OTHER): Payer: BLUE CROSS/BLUE SHIELD

## 2015-11-17 ENCOUNTER — Ambulatory Visit: Payer: BLUE CROSS/BLUE SHIELD

## 2015-11-17 VITALS — BP 132/80 | HR 79 | Temp 97.9°F | Resp 16 | Ht 71.0 in | Wt 159.8 lb

## 2015-11-17 DIAGNOSIS — M542 Cervicalgia: Secondary | ICD-10-CM

## 2015-11-17 DIAGNOSIS — M5440 Lumbago with sciatica, unspecified side: Secondary | ICD-10-CM

## 2015-11-17 MED ORDER — CYCLOBENZAPRINE HCL 10 MG PO TABS: 10.0000 mg | ORAL_TABLET | Freq: Three times a day (TID) | ORAL | 0 refills | Status: DC | PRN

## 2015-11-17 MED ORDER — MELOXICAM 15 MG PO TABS: 15.0000 mg | ORAL_TABLET | Freq: Every day | ORAL | 1 refills | Status: DC

## 2015-11-17 NOTE — Patient Instructions (Addendum)
Take cyclobenzaprine 10 mg up to 3 times daily as needed.  Take Meloxicam 15 mg once daily with food.    IF you received an x-ray today, you will receive an invoice from [facility]. Please contact [facility] at [phone] with questions or concerns regarding your invoice.   IF you received labwork today, you will receive an invoice from [facility]. Please contact [facility] at [phone] with questions or concerns regarding your invoice.   Our billing staff will not be able to assist you with questions regarding bills from these companies.  You will be contacted with the lab results as soon as they are available. The fastest way to get your results is to activate your My Chart account. Instructions are located on the last page of this paperwork. If you have not heard from us regarding the results in 2 weeks, please contact this office.     Back Pain, Adult Back pain is very common. The pain often gets better over time. The cause of back pain is usually not dangerous. Most people can learn to manage their back pain on their own.  HOME CARE  Watch your back pain for any changes. The following actions may help to lessen any pain you are feeling:  Stay active. Start with short walks on flat ground if you can. Try to walk farther each day.  Exercise regularly as told by your doctor. Exercise helps your back heal faster. It also helps avoid future injury by keeping your muscles strong and flexible.  Do not sit, drive, or stand in one place for more than 30 minutes.  Do not stay in bed. Resting more than 1-2 days can slow down your recovery.  Be careful when you bend or lift an object. Use good form when lifting:  Bend at your knees.  Keep the object close to your body.  Do not twist.  Sleep on a firm mattress. Lie on your side, and bend your knees. If you lie on your back, put a pillow under your knees.  Take medicines only as told by  your doctor.  Put ice on the injured area.  Put ice in a plastic bag.  Place a towel between your skin and the bag.  Leave the ice on for 20 minutes, 2-3 times a day for the first 2-3 days. After that, you can switch between ice and heat packs.  Avoid feeling anxious or stressed. Find good ways to deal with stress, such as exercise.  Maintain a healthy weight. Extra weight puts stress on your back. GET HELP IF:   You have pain that does not go away with rest or medicine.  You have worsening pain that goes down into your legs or buttocks.  You have pain that does not get better in one week.  You have pain at night.  You lose weight.  You have a fever or chills. GET HELP RIGHT AWAY IF:   You cannot control when you poop (bowel movement) or pee (urinate).  Your arms or legs feel weak.  Your arms or legs lose feeling (numbness).  You feel sick to your stomach (nauseous) or throw up (vomit).  You have belly (abdominal) pain.  You feel like you may pass out (faint).   This information is not intended to replace advice given to you by your health care provider. Make sure you discuss any questions you have with your health care provider.   Document Released: 08/11/2007 Document Revised: 03/15/2014 Document  Reviewed: 06/26/2013 Elsevier Interactive Patient Education 2016 Elsevier Inc.  Place neck pain patient instructions here.
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
## 2015-11-19 ENCOUNTER — Ambulatory Visit: Payer: BLUE CROSS/BLUE SHIELD

## 2015-11-19 ENCOUNTER — Ambulatory Visit (INDEPENDENT_AMBULATORY_CARE_PROVIDER_SITE_OTHER): Payer: BLUE CROSS/BLUE SHIELD | Admitting: Family Medicine

## 2015-11-19 VITALS — BP 114/70 | HR 77 | Temp 98.2°F | Resp 18 | Ht 71.0 in | Wt 156.8 lb

## 2015-11-19 DIAGNOSIS — M545 Low back pain: Secondary | ICD-10-CM

## 2015-11-19 NOTE — Patient Instructions (Addendum)
It was good to see you today.   You have a pretty noticeable back spasm.  The treatments for backpain are physical therapy, heat, and massage.  I will refer you to physical therapy.  Try a heating pad 20 minutes on and 20 minutes off several times a day.  Massage it a couple of times today as well.  Take ibuprofen 2 pills every 6 - 8 hours for inflammation and pain relief.    I can fill out the paperwork when it comes in.    IF you received an x-ray today, you will receive an invoice from [facility]. Please contact [facility] at [phone] with questions or concerns regarding your invoice.   IF you received labwork today, you will receive an invoice from [facility]. Please contact [facility] at [phone] with questions or concerns regarding your invoice.   Our billing staff will not be able to assist you with questions regarding bills from these companies.  You will be contacted with the lab results as soon as they are available. The fastest way to get your results is to activate your My Chart account. Instructions are located on the last page of this paperwork. If you have not heard from us regarding the results in 2 weeks, please contact this office.    ;
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
## 2015-11-26 ENCOUNTER — Encounter: Payer: Self-pay | Admitting: Physical Therapy

## 2015-11-26 ENCOUNTER — Ambulatory Visit: Payer: BLUE CROSS/BLUE SHIELD | Attending: Family Medicine | Admitting: Physical Therapy

## 2015-11-26 DIAGNOSIS — R293 Abnormal posture: Secondary | ICD-10-CM | POA: Diagnosis present

## 2015-11-26 DIAGNOSIS — M62830 Muscle spasm of back: Secondary | ICD-10-CM

## 2015-11-26 DIAGNOSIS — M545 Low back pain, unspecified: Secondary | ICD-10-CM
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
## 2015-11-28 ENCOUNTER — Telehealth: Payer: Self-pay

## 2015-11-28 NOTE — Telephone Encounter (Signed)
Called patient back and left him a voicemail to call me back as soon as he can to see about getting his disability extended.    Will wait for patient to call back

## 2015-11-28 NOTE — Telephone Encounter (Signed)
Patient just had his disability paperwork and needs it extended for 4 weeks because that's the length of the rehab. Patient states that he needs the letter today. I informed patient that since Dr. Gwendolyn GrantWalden isn't here I'm not positive it can be done today with such short notice. Please advise! [phone]

## 2015-12-01 ENCOUNTER — Encounter: Payer: Self-pay | Admitting: Family Medicine

## 2015-12-01 NOTE — Telephone Encounter (Signed)
Patient came into clinic on 12/01/15 for a letter to be out of work to cover his PT which is for 4 weeks. Jerrilyn CairoKim Harris, wrote a note excusing him from work until 12/27/15 I let him know that if he needs more time he will have to come in and be seen by either Tiburcio PeaHarris or Gwendolyn GrantWalden. He stated he understood took his copy of the letter and left. I also faxed a copy of the letter to his employer.

## 2015-12-01 NOTE — Progress Notes (Signed)
Patient arrived in the office to obtain a letter excusing him for work for the next 4 weeks while he completed physical therapy for low back pain with sciatica.  Godfrey PickKimberly S. Tiburcio PeaHarris, MSN, FNP-C [facility]

## 2015-12-03 ENCOUNTER — Telehealth: Payer: Self-pay

## 2015-12-03 ENCOUNTER — Ambulatory Visit: Payer: BLUE CROSS/BLUE SHIELD | Admitting: Physical Therapy

## 2015-12-03 NOTE — Telephone Encounter (Signed)
Patient needs disability forms completed with updated return to work date, I have completed the forms and they just need to be signed. I will place the forms in Argyle[place]berly Harris box on 12/03/15 if you could please return them to the FMLA/Disability box at the 102 checkout desk within 5-7 business days. Thank you

## 2015-12-04 ENCOUNTER — Ambulatory Visit: Payer: BLUE CROSS/BLUE SHIELD | Admitting: Physical Therapy

## 2015-12-06 NOTE — Telephone Encounter (Signed)
Please completed documentation in FMLA  Box.  William PickKimberly S. Tiburcio PeaHarris, MSN, FNP-C [facility]

## 2015-12-12 NOTE — Telephone Encounter (Signed)
Paperwork [facility] and [facility] to [facility] and also gave patient a copy.

## 2015-12-29 ENCOUNTER — Telehealth: Payer: Self-pay | Admitting: Family Medicine

## 2015-12-29 NOTE — Telephone Encounter (Signed)
Pt need note for work updated  from a month ago stating that its ok to return back to work yesterday or today he states that he need note faxed to his job at [phone] he states the Caitlyn knows about this but its not FMLA but anyone can update this note from a moth ago

## 2015-12-30 ENCOUNTER — Other Ambulatory Visit: Payer: Self-pay | Admitting: Family Medicine

## 2016-01-01 NOTE — Telephone Encounter (Signed)
Pt was in 12/31/2015. He picked up the note no documentation that it was given to him.

## 2016-04-20 ENCOUNTER — Ambulatory Visit (INDEPENDENT_AMBULATORY_CARE_PROVIDER_SITE_OTHER): Payer: BLUE CROSS/BLUE SHIELD | Admitting: Family Medicine

## 2016-04-20 DIAGNOSIS — G43009 Migraine without aura, not intractable, without status migrainosus: Secondary | ICD-10-CM | POA: Diagnosis not present

## 2016-04-20 MED ORDER — TOPIRAMATE 25 MG PO CPSP: 50.0000 mg | ORAL_CAPSULE | Freq: Every day | ORAL | 2 refills | Status: AC

## 2016-04-20 NOTE — Patient Instructions (Addendum)
I will authorize your FMLA for 3 months for headache. Resume preventative therapy for migraine headaches. Topamax 25 mg x 1 week and then increase 50 mg daily for preventative treatment.   IF you received an x-ray today, you will receive an invoice from [facility]. Please contact [facility] at [phone] with questions or concerns regarding your invoice.   IF you received labwork today, you will receive an invoice from [facility]. Please contact [facility] at [phone] with questions or concerns regarding your invoice.   Our billing staff will not be able to assist you with questions regarding bills from these companies.  You will be contacted with the lab results as soon as they are available. The fastest way to get your results is to activate your My Chart account. Instructions are located on the last page of this paperwork. If you have not heard from us regarding the results in 2 weeks, please contact this office.      Migraine Headache A migraine headache is a very strong throbbing pain on one side or both sides of your head. Migraines can also cause other symptoms. Talk with your doctor about what things may bring on (trigger) your migraine headaches. Follow these instructions at home: Medicines  Take over-the-counter and prescription medicines only as told by your doctor.  Do not drive or use heavy machinery while taking prescription pain medicine.  To prevent or treat constipation while you are taking prescription pain medicine, your doctor may recommend that you:  Drink enough fluid to keep your pee (urine) clear or pale yellow.  Take over-the-counter or prescription medicines.  Eat foods that are high in fiber. These include fresh fruits and vegetables, whole grains, and beans.  Limit foods that are high in fat and processed sugars. These include fried and sweet foods. Lifestyle  Avoid alcohol.  Do not use any products that contain nicotine or  tobacco, such as cigarettes and e-cigarettes. If you need help quitting, ask your doctor.  Get at least 8 hours of sleep every night.  Limit your stress. General instructions  Keep a journal to find out what may bring on your migraines. For example, write down:  What you eat and drink.  How much sleep you get.  Any change in what you eat or drink.  Any change in your medicines.  If you have a migraine:  Avoid things that make your symptoms worse, such as bright lights.  It may help to lie down in a dark, quiet room.  Do not drive or use heavy machinery.  Ask your doctor what activities are safe for you.  Keep all follow-up visits as told by your doctor. This is important. Contact a doctor if:  You get a migraine that is different or worse than your usual migraines. Get help right away if:  Your migraine gets very bad.  You have a fever.  You have a stiff neck.  You have trouble seeing.  Your muscles feel weak or like you cannot control them.  You start to lose your balance a lot.  You start to have trouble walking.  You pass out (faint). This information is not intended to replace advice given to you by your health care provider. Make sure you discuss any questions you have with your health care provider. Document Released: 12/02/2007 Document Revised: 09/12/2015 Document Reviewed: 08/11/2015 Elsevier Interactive Patient Education  2017 [employer]

## 2016-04-20 NOTE — Progress Notes (Signed)
Patient ID: William Hardy, male    DOB: 02/08/1996, 21 y.o.   MRN: [id number]  PCP: No PCP Per Patient  Chief Complaint  Patient presents with  . FMLA paperwork    Subjective:  HPI [age] year male, current everyday smoker,  presents requesting completion of FMLA paperwork for chronic migraine headaches. Patient was last seen in office for migraine headaches 07/21/2015 by Dr. Alwyn RenHopper. During that visit the patient was placed on preventative headache therapy with Topamax 50 mg daily, butalbital acetaminophen caffeine for acute severe headaches, and otc Excedrin for mild headaches. He was advised to return for follow-up for re-evaluation of headaches. Pt never returned for follow-up as he states " he didn't want to pay another Co-pay". He reports that he stopped taking the Topamax as he did not want to pay for medication every month. He reports during headaches he takes Excedrin and lays down which resolves his headaches. He is requesting paperwork that will permit him to leave work and excuse absences related to acute migraine headaches. Reports that he works in a very loud, bright, environment that cause and or exacerbate headaches Reviewed prior FMLA documentation: Within the media section of EMR dated 07/22/15 is a scanned copy of last FMLA documentation which estimated frequency of headaches 1 time per week with an average duration per episode of 8 hours -2 days.   Social History   Social History  . Marital status: Single    Spouse name: N/A  . Number of children: N/A  . Years of education: N/A   Occupational History  . Not on file.   Social History Main Topics  . Smoking status: Current Every Day Smoker  . Smokeless tobacco: Never Used  . Alcohol use No  . Drug use: No  . Sexual activity: Not on file   Other Topics Concern  . Not on file   Social History Narrative  . No narrative on file    No family history on file.   Review of Systems See HPI    Prior to  Admission medications   Not on File  Past Medical, Surgical Family and Social History reviewed and updated.  Objective:   Today's Vitals   04/20/16 1624  BP: 112/76  Pulse: 81  Resp: 16  Temp: 98.4 F (36.9 C)  TempSrc: Oral  SpO2: 97%  Weight: 162 lb (73.5 kg)  Height: 5\' 10"  (1.778 m)    Wt Readings from Last 3 Encounters:  04/20/16 162 lb (73.5 kg)  11/19/15 156 lb 12.8 oz (71.1 kg)  11/17/15 159 lb 12.8 oz (72.5 kg)   Physical Exam Administrative paperwork visit only. VS reviewed and stable.   Assessment & Plan:  Discussed in great detail with patient the importance of therapy compliance in order to prevent headaches and their severity. He reports relief of headaches with OTC Excedrin and rest. Again reinforced the goal is for him to be able to work and not experience headaches. Explained, that it is counterproductive to authorize FMLA for a condition in which he  is not willing to participate in therapy in order to achieve remission of headaches. Reinforced that simply being permitted to frequently be absent from work is not prudent nor improving his overall quality of life as a migraine sufferer. I agreed to complete FMLA paperwork for only 3 months providing reasonable time away from work for follow-up appointments and re-evaluation of migraine headaches. Restarting headache prevention therapy: -Topamax 25 mg x 1 week and then increase  50 mg daily for preventative treatment.  -Continue Excedrin for acute headaches as needed. -Return for follow-up with Dr. Creta Levin in 3 months for re-evaluation of condition. If therapy is not effective in preventing headaches, discussed likely referral to headache clinic. -Suggested if headaches are exacerbated by work environment he may need to consider changing positions within his company or obtaining employment elsewhere. -FMLA paperwork placed in [place] for clerical staff to review.  Total of 30 minutes  spent with pt., greater than  50% which was spent in discussion of tx options of headaches, review of prior notes and FMLA documentation, appropriate use and indications for FMLA.  Godfrey Pick. Tiburcio Pea, MSN, FNP-C Primary Care at [facility] [phone]

## 2016-04-21 ENCOUNTER — Telehealth: Payer: Self-pay | Admitting: Family Medicine

## 2016-04-21 ENCOUNTER — Telehealth: Payer: Self-pay

## 2016-04-21 NOTE — Telephone Encounter (Signed)
Luther Parodyaitlin, I placed FMLA paperwork for [employer] in your inbox. I have a copy with me also as I am out of the office until Friday. I am completing my progress note today. If you could complete the portions of the documentation that you can and place the paperwork in my inbox I will complete my portion and return to you.  Thanks  Godfrey PickKimberly S. Tiburcio PeaHarris, MSN, FNP-C Primary Care at [facility] [phone]

## 2016-04-21 NOTE — Telephone Encounter (Signed)
Patient needs FMLA forms updated from last year for his migraines by Joaquin CourtsKimberly Harris. I have completed the forms based off the old FMLA forms and highlighted the areas that need to be updated based off Mrs. Harris OV notes. I will place the forms in your box on 04/21/16 please return them to the FMLA/Disability box at the 102 checkout desk within 5-7 business day. Thank you!

## 2016-04-24 NOTE — Telephone Encounter (Signed)
done

## 2016-04-27 DIAGNOSIS — Z0271 Encounter for disability determination: Secondary | ICD-10-CM

## 2016-05-03 ENCOUNTER — Ambulatory Visit (INDEPENDENT_AMBULATORY_CARE_PROVIDER_SITE_OTHER): Payer: BLUE CROSS/BLUE SHIELD | Admitting: Family Medicine

## 2016-05-03 VITALS — BP 108/60 | HR 87 | Temp 98.0°F | Resp 18 | Ht 70.0 in | Wt 165.0 lb

## 2016-05-03 DIAGNOSIS — R519 Headache, unspecified: Secondary | ICD-10-CM

## 2016-05-03 DIAGNOSIS — R51 Headache: Secondary | ICD-10-CM

## 2016-05-03 DIAGNOSIS — Z029 Encounter for administrative examinations, unspecified: Secondary | ICD-10-CM

## 2016-05-03 NOTE — Progress Notes (Signed)
Patient ID: William Hardy, male    DOB: 09/23/1995, 21 y.o.   MRN: [id number]  PCP: William BosworthZoe A Stallings, MD  Chief Complaint  Patient presents with  . Follow-up    Subjective:  HPI  21 year old male presents for evaluation of FMLA and stress. He presents today requesting a total of two days per week off of work for headaches. Reports under lots of stress helping to care for his younger siblings, going to school full-time, and with all of these events he reports feeling that his headaches are increasing in frequency. Reports that the last provider permitted him the additional time off for headaches and to manage his personal responsibilities. He reports compliance with taking Topamax although he reports medication makes him sleepy and he has been taking medication routinely. He is uncertain as whether he has recently had a migraine or a regular headache. He is insistent regarding having his FMLA paperwork changed to reflect permission for 2 days off weekly for headaches.  Social History   Social History  . Marital status: Single    Spouse name: N/A  . Number of children: N/A  . Years of education: N/A   Occupational History  . Not on file.   Social History Main Topics  . Smoking status: Current Every Day Smoker  . Smokeless tobacco: Never Used  . Alcohol use No  . Drug use: No  . Sexual activity: Not on file   Other Topics Concern  . Not on file   Social History Narrative  . No narrative on file   Review of Systems See HPI  Prior to Admission medications   Medication Sig Start Date End Date Taking? Authorizing Provider  topiramate (TOPAMAX) 25 MG capsule Take 2 capsules (50 mg total) by mouth daily. Hardy  Yes William AskewKimberly Stephenia Avy Barlett, FNP    Past Medical, Surgical Family and Social History reviewed and updated.    Objective:   Today's Vitals   05/03/16 1540  BP: 108/60  Pulse: 87  Resp: 18  Temp: 98 F (36.7 C)  TempSrc: Oral  SpO2: 97%  Weight: 165  lb (74.8 kg)  Height: 5\' 10"  (1.778 m)    Physical Exam Vital signs reviewed and stable Administrative visit only.    Assessment & Plan:  1. Nonintractable headache, unspecified chronicity pattern, unspecified headache type  William Hardy presents today requesting to have adjustments made to his FMLA permitting him additional days off work.  His prior FMLA allotted him 2 days per week for migraine headaches. From my prior visit with William Hardy he reported that he had adhered to the prescribed regimen originally prescribed by Dr. Alwyn RenHopper for headache management. During the visit with me on Hardy I advised that patient that I would permit FMLA short-term only to allow time for him to adequately trial Topamax for headache prevention therapy. Within that FMLA I indicated that he would only be permitted a maximum of two days off per month and if additional days are needed, he would require further evaluation by a headache specialist. Patient was insistent today to see a headache specialist after I declined his request to extend his FMLA to allow 2 days off per week for chronic headaches.  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  Plan: -I have placed a referral to the headache clinic-per patients request -Recommended that he starts a headache journal. -Continue Topamax 50 mg at bedtime for headache prevention. -Continue Excedrin for acute headaches    Godfrey Pick. Tiburcio Pea, MSN, FNP-C Primary Care at [facility] [phone]

## 2016-05-03 NOTE — Patient Instructions (Addendum)
     IF you received an x-ray today, you will receive an invoice from [facility]. Please contact [facility] at [phone] with questions or concerns regarding your invoice.   IF you received labwork today, you will receive an invoice from [facility]. Please contact [facility] at [phone] with questions or concerns regarding your invoice.   Our billing staff will not be able to assist you with questions regarding bills from these companies.  You will be contacted with the lab results as soon as they are available. The fastest way to get your results is to activate your My Chart account. Instructions are located on the last page of this paperwork. If you have not heard from us regarding the results in 2 weeks, please contact this office.     

## 2016-07-21 ENCOUNTER — Ambulatory Visit: Payer: BLUE CROSS/BLUE SHIELD | Admitting: Physician Assistant

## 2018-01-25 IMAGING — DX DG LUMBAR SPINE COMPLETE 4+V
5 series · 5 of 5 positions shown · non-contrast
Comparison: None.

CLINICAL DATA: Low back pain 2 weeks.

EXAM:
LUMBAR SPINE - COMPLETE 4+ VIEW

[l-spine ap]
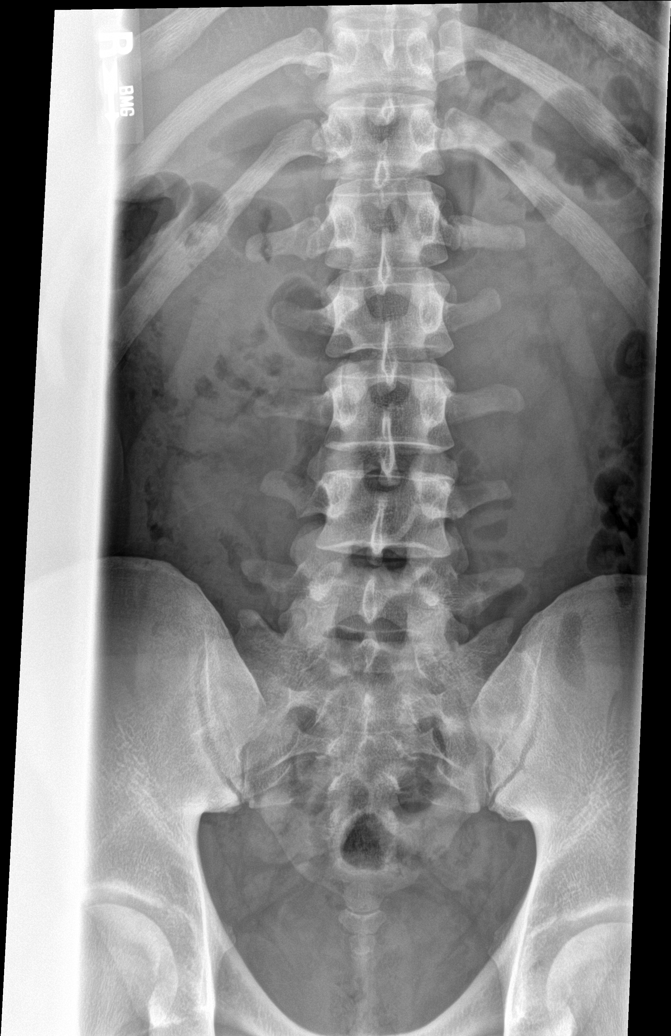

[l-spine obl (1 of 2)]
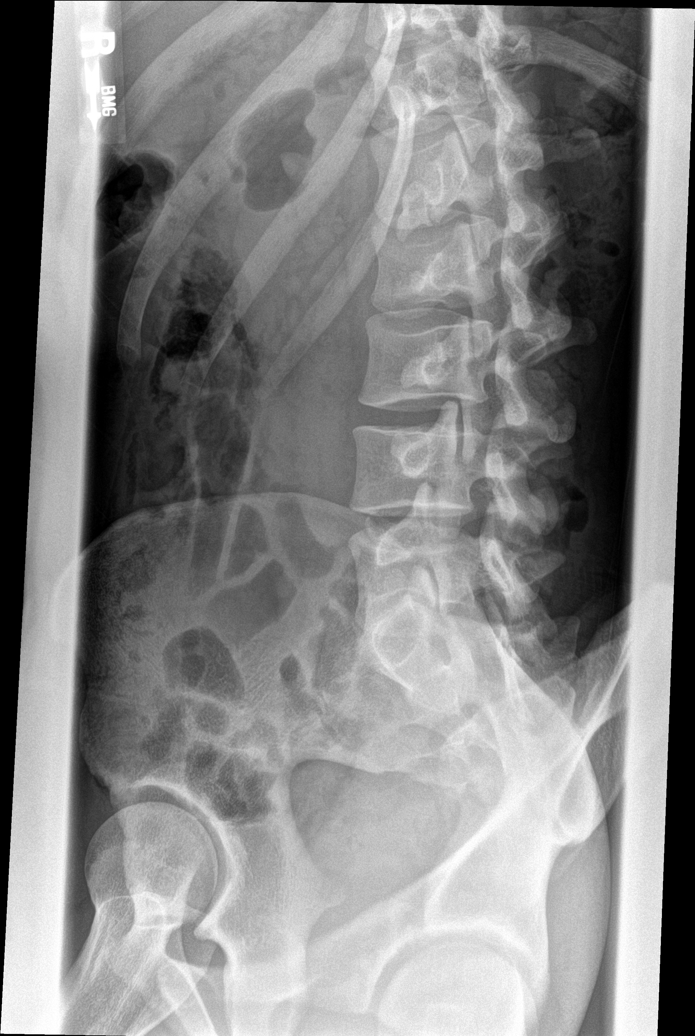

[l-spine obl (2 of 2)]
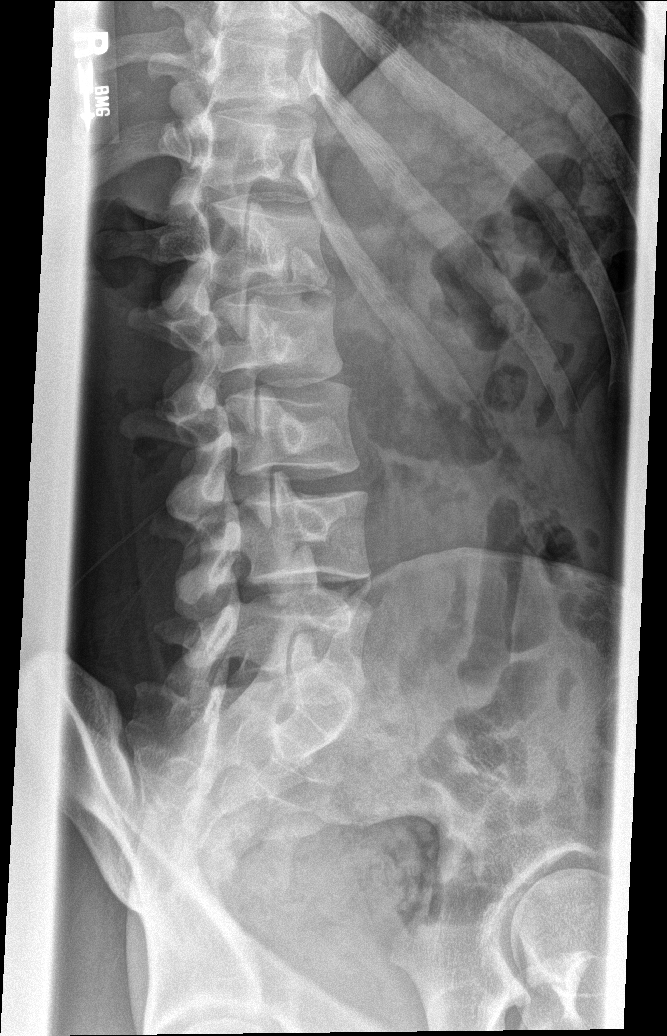

[l-spine lat]
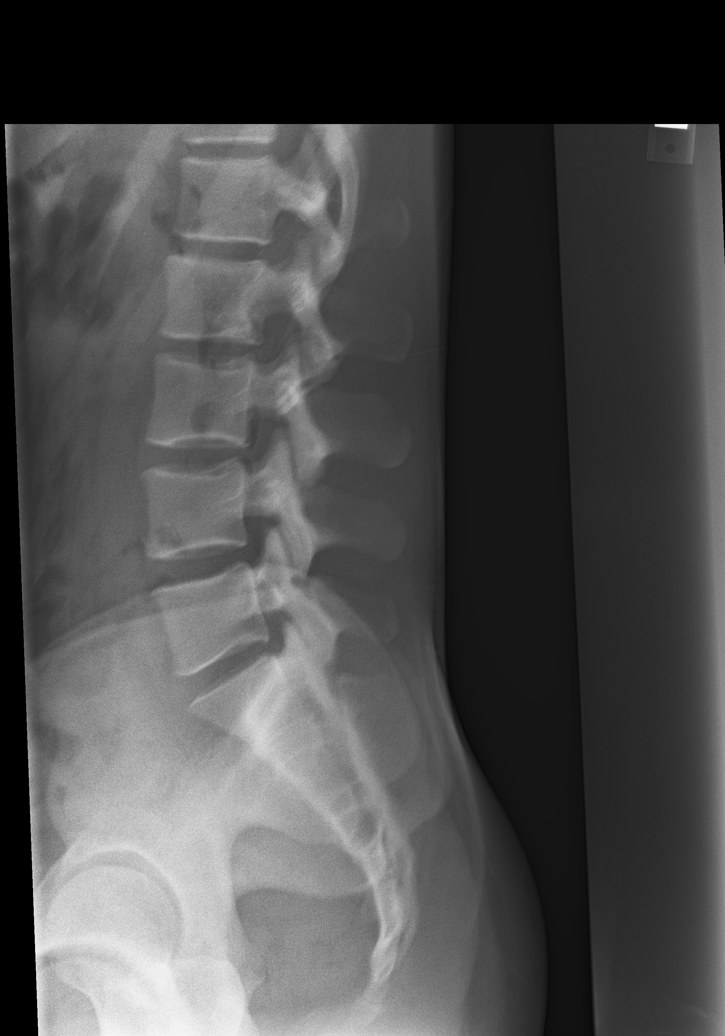

[l-spine l5-s1]
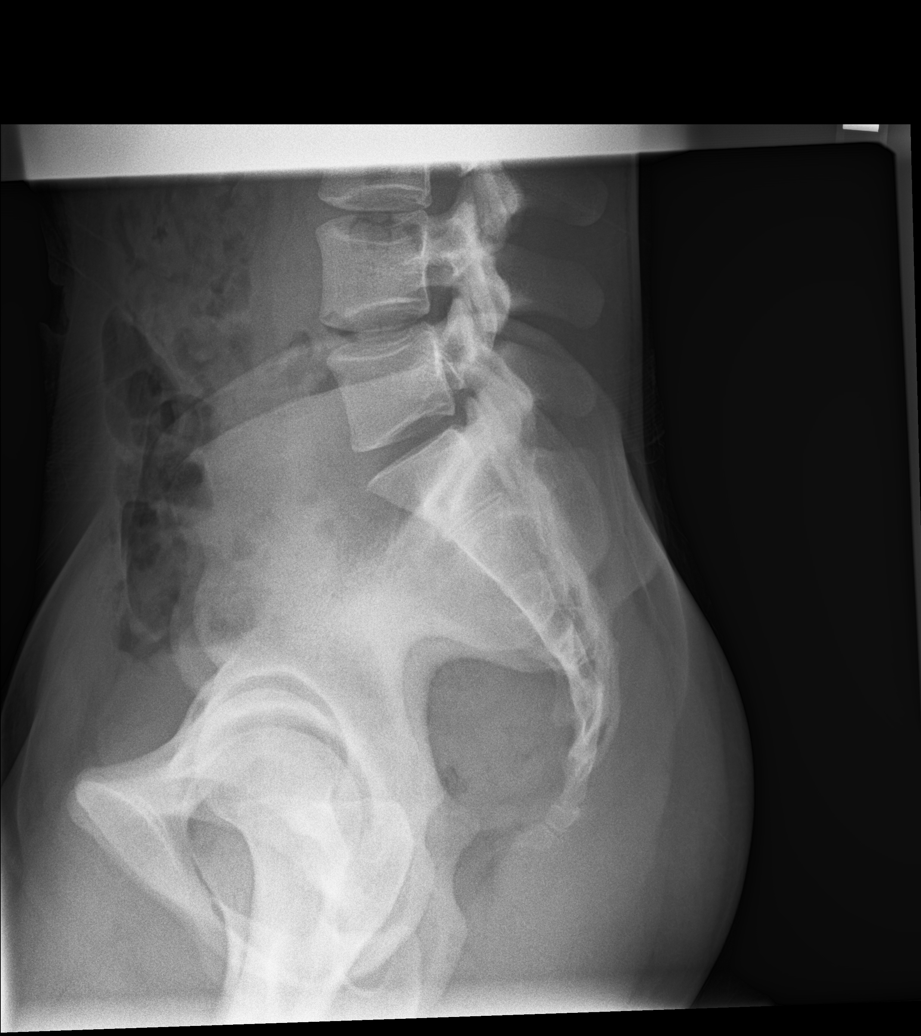

[5 of 5 positions shown; findings below may reference images not displayed]

FINDINGS: Minimal motion artifact on the lateral view. Vertebral body
alignment, heights and disc space heights are within normal. There
is no compression fracture or subluxation. Remainder of the exam is
within normal.
IMPRESSION: Negative.

## 2021-09-12 ENCOUNTER — Other Ambulatory Visit: Payer: Self-pay

## 2021-09-12 ENCOUNTER — Encounter (HOSPITAL_BASED_OUTPATIENT_CLINIC_OR_DEPARTMENT_OTHER): Payer: Self-pay | Admitting: Emergency Medicine

## 2021-09-12 DIAGNOSIS — Z5321 Procedure and treatment not carried out due to patient leaving prior to being seen by health care provider: Secondary | ICD-10-CM | POA: Insufficient documentation

## 2021-09-12 DIAGNOSIS — R200 Anesthesia of skin: Secondary | ICD-10-CM | POA: Insufficient documentation

## 2021-09-12 NOTE — ED Triage Notes (Signed)
Pt with ongoing reflux issue since February. Prescribed pantoprazole, not taking as prescribed. Also has "numb" sensation in throat, "cant get a burp out, makes me feel like I cant breathe."   Denies CP.  Pt speaking in full sentences, NAD.

## 2021-09-13 ENCOUNTER — Emergency Department (HOSPITAL_BASED_OUTPATIENT_CLINIC_OR_DEPARTMENT_OTHER)
Admission: EM | Admit: 2021-09-13 | Discharge: 2021-09-13 | Disposition: A | Discharge status: Home / Self Care | Payer: 59 | Attending: Emergency Medicine | Admitting: Emergency Medicine

## 2022-03-16 ENCOUNTER — Other Ambulatory Visit: Payer: Self-pay

## 2022-03-16 DIAGNOSIS — R09A2 Foreign body sensation, throat: Secondary | ICD-10-CM

## 2022-03-17 ENCOUNTER — Other Ambulatory Visit: Payer: Self-pay | Admitting: Gastroenterology

## 2022-03-17 DIAGNOSIS — R09A2 Foreign body sensation, throat: Secondary | ICD-10-CM

## 2022-04-06 ENCOUNTER — Ambulatory Visit
Admission: RE | Admit: 2022-04-06 | Discharge: 2022-04-06 | Disposition: A | Discharge status: Home / Self Care | Payer: 59 | Source: Ambulatory Visit | Attending: Gastroenterology | Admitting: Gastroenterology

## 2022-04-06 DIAGNOSIS — R09A2 Foreign body sensation, throat: Secondary | ICD-10-CM

## 2022-11-17 ENCOUNTER — Emergency Department (HOSPITAL_COMMUNITY)
Admission: EM | Admit: 2022-11-17 | Discharge: 2022-11-17 | Disposition: A | Discharge status: Home / Self Care | Payer: 59

## 2022-11-17 NOTE — ED Notes (Addendum)
Delay on triage. PT state he need to run back to car and will be a few mins

## 2022-11-30 ENCOUNTER — Other Ambulatory Visit (HOSPITAL_COMMUNITY): Payer: Self-pay | Admitting: Physician Assistant

## 2022-11-30 DIAGNOSIS — R300 Dysuria: Secondary | ICD-10-CM

## 2022-12-01 ENCOUNTER — Ambulatory Visit (HOSPITAL_COMMUNITY)
Admission: RE | Admit: 2022-12-01 | Discharge: 2022-12-01 | Disposition: A | Discharge status: Home / Self Care | Payer: 59 | Source: Ambulatory Visit | Attending: Physician Assistant | Admitting: Physician Assistant

## 2022-12-01 DIAGNOSIS — R300 Dysuria: Secondary | ICD-10-CM | POA: Diagnosis present
